# Patient Record
Sex: Female | Born: 1975 | Race: Black or African American | Hispanic: No | Marital: Single | Smoking: Current every day smoker
Health system: Southern US, Community
[De-identification: ages and names within clinical notes are randomized; demographics above are authoritative.]

## PROBLEM LIST (undated history)

## (undated) ENCOUNTER — Inpatient Hospital Stay (HOSPITAL_COMMUNITY): Payer: Self-pay

## (undated) DIAGNOSIS — O09299 Supervision of pregnancy with other poor reproductive or obstetric history, unspecified trimester: Secondary | ICD-10-CM

## (undated) HISTORY — PX: DILATION AND CURETTAGE OF UTERUS: SHX78

---

## 2001-11-04 ENCOUNTER — Encounter (INDEPENDENT_AMBULATORY_CARE_PROVIDER_SITE_OTHER): Payer: Self-pay | Admitting: *Deleted

## 2001-11-04 ENCOUNTER — Observation Stay (HOSPITAL_COMMUNITY): Admission: AD | Admit: 2001-11-04 | Discharge: 2001-11-05 | Payer: Self-pay | Admitting: *Deleted

## 2001-11-05 ENCOUNTER — Encounter: Payer: Self-pay | Admitting: *Deleted

## 2009-08-14 ENCOUNTER — Inpatient Hospital Stay (HOSPITAL_COMMUNITY): Admission: AD | Admit: 2009-08-14 | Discharge: 2009-08-16 | Payer: Self-pay | Admitting: Obstetrics & Gynecology

## 2009-08-14 ENCOUNTER — Ambulatory Visit: Payer: Self-pay | Admitting: Family

## 2010-12-18 LAB — CBC
HCT: 37.3 % (ref 36.0–46.0)
Hemoglobin: 12.2 g/dL (ref 12.0–15.0)
MCHC: 32.8 g/dL (ref 30.0–36.0)
MCV: 95.2 fL (ref 78.0–100.0)
Platelets: 279 10*3/uL (ref 150–400)
RBC: 3.92 MIL/uL (ref 3.87–5.11)
RDW: 14.3 % (ref 11.5–15.5)
WBC: 16.2 10*3/uL — ABNORMAL HIGH (ref 4.0–10.5)

## 2010-12-18 LAB — RAPID URINE DRUG SCREEN, HOSP PERFORMED
Amphetamines: NOT DETECTED
Barbiturates: NOT DETECTED
Benzodiazepines: NOT DETECTED
Cocaine: NOT DETECTED
Opiates: NOT DETECTED
Tetrahydrocannabinol: POSITIVE — AB

## 2010-12-18 LAB — ABO/RH: ABO/RH(D): O POS

## 2010-12-18 LAB — HEPATITIS B SURFACE ANTIGEN: Hepatitis B Surface Ag: NEGATIVE

## 2010-12-18 LAB — TYPE AND SCREEN
ABO/RH(D): O POS
Antibody Screen: NEGATIVE

## 2010-12-18 LAB — RPR: RPR Ser Ql: NONREACTIVE

## 2010-12-18 LAB — HIV ANTIBODY (ROUTINE TESTING W REFLEX): HIV: NONREACTIVE

## 2011-01-31 NOTE — Op Note (Signed)
[facility]  Patient:    [name], ZIRKELBACH Visit Number: [id number] MRN: [id number]          Service Type: Attending:  Ronda Fairly. Galen Daft, M.D. Dictated by:   Ronda Fairly. Galen Daft, M.D. Proc. Date: 11/05/01                             Operative Report  PREOPERATIVE DIAGNOSIS:       Incomplete abortion and inevitable abortion.  POSTOPERATIVE DIAGNOSIS:      Incomplete abortion and inevitable abortion.  OPERATION:                    Suction dilatation and evacuation.  SURGEON:                      Ronda Fairly. Galen Daft, M.D.  ANESTHESIA:                   Intravenous sedation and local anesthesia.  COMPLICATIONS:                None.  ESTIMATED BLOOD LOSS:         Less than 5 cc.  DESCRIPTION OF PROCEDURE:     The patient was identified as Shelley Duffy. Informed consent was obtained prior to entering the operating room.  In the operating room, she had already received preoperative antibiotics and was given intravenous sedation and 8 cc of 1% lidocaine was utilized around the cervix for a block.  Betadine prep and sterile technique.  Bladder catheterized and emptied.  The uterus was 8-10 weeks size on examination and the cervix was already dilated to accept a 10 mm suction curet.  This was placed at the top of the cavity and 60 mm pressure was drawn up and the tissue was removed without difficulty on the first pass.  There was sharp curet utilized in all the quadrants afterwards to make sure that there was no additional tissue and there was not.  There was no active bleeding at the end of the procedure.  The patient tolerated the procedure quite well and left the operating room in stable condition.  All instrument, sponge and needle counts were correct at the end of the case and they were correct throughout the case. There were no complications. Dictated by:   Ronda Fairly. Galen Daft, M.D. Attending:  Ronda Fairly. Galen Daft, M.D. DD:  11/05/01 TD:  11/05/01 Job:  [id number]

## 2011-02-13 ENCOUNTER — Emergency Department (HOSPITAL_COMMUNITY): Payer: No Typology Code available for payment source

## 2011-02-13 ENCOUNTER — Emergency Department (HOSPITAL_COMMUNITY)
Admission: EM | Admit: 2011-02-13 | Discharge: 2011-02-13 | Disposition: A | Discharge status: Home / Self Care | Payer: No Typology Code available for payment source | Attending: Emergency Medicine | Admitting: Emergency Medicine

## 2011-02-13 DIAGNOSIS — S8000XA Contusion of unspecified knee, initial encounter: Secondary | ICD-10-CM | POA: Insufficient documentation

## 2011-02-13 DIAGNOSIS — S335XXA Sprain of ligaments of lumbar spine, initial encounter: Secondary | ICD-10-CM | POA: Insufficient documentation
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
## 2012-02-20 ENCOUNTER — Encounter (HOSPITAL_COMMUNITY): Payer: Self-pay

## 2012-02-20 ENCOUNTER — Inpatient Hospital Stay (HOSPITAL_COMMUNITY)
Admission: AD | Admit: 2012-02-20 | Discharge: 2012-02-20 | Disposition: A | Discharge status: Home / Self Care | Payer: Self-pay | Source: Ambulatory Visit | Attending: Obstetrics & Gynecology | Admitting: Obstetrics & Gynecology

## 2012-02-20 DIAGNOSIS — N949 Unspecified condition associated with female genital organs and menstrual cycle: Secondary | ICD-10-CM

## 2012-02-20 DIAGNOSIS — A499 Bacterial infection, unspecified: Secondary | ICD-10-CM

## 2012-02-20 DIAGNOSIS — R109 Unspecified abdominal pain: Secondary | ICD-10-CM | POA: Insufficient documentation

## 2012-02-20 DIAGNOSIS — O2390 Unspecified genitourinary tract infection in pregnancy, unspecified trimester: Secondary | ICD-10-CM | POA: Insufficient documentation

## 2012-02-20 DIAGNOSIS — B9689 Other specified bacterial agents as the cause of diseases classified elsewhere: Secondary | ICD-10-CM | POA: Insufficient documentation

## 2012-02-20 DIAGNOSIS — N760 Acute vaginitis: Secondary | ICD-10-CM | POA: Insufficient documentation

## 2012-02-20 LAB — CBC
Hemoglobin: 10.2 g/dL — ABNORMAL LOW (ref 12.0–15.0)
MCV: 91.9 fL (ref 78.0–100.0)
Platelets: 261 10*3/uL (ref 150–400)
RBC: 3.21 MIL/uL — ABNORMAL LOW (ref 3.87–5.11)
WBC: 14.3 10*3/uL — ABNORMAL HIGH (ref 4.0–10.5)

## 2012-02-20 LAB — URINALYSIS, ROUTINE W REFLEX MICROSCOPIC
Bilirubin Urine: NEGATIVE
Glucose, UA: NEGATIVE mg/dL
Hgb urine dipstick: NEGATIVE
Specific Gravity, Urine: 1.01 (ref 1.005–1.030)
Urobilinogen, UA: 0.2 mg/dL (ref 0.0–1.0)
pH: 6 (ref 5.0–8.0)

## 2012-02-20 LAB — WET PREP, GENITAL: Trich, Wet Prep: NONE SEEN

## 2012-02-20 LAB — DIFFERENTIAL
Eosinophils Relative: 1 % (ref 0–5)
Lymphocytes Relative: 18 % (ref 12–46)
Lymphs Abs: 2.6 10*3/uL (ref 0.7–4.0)
Monocytes Relative: 8 % (ref 3–12)

## 2012-02-20 MED ORDER — METRONIDAZOLE 500 MG PO TABS: 500.0000 mg | ORAL_TABLET | Freq: Two times a day (BID) | ORAL | Status: AC

## 2012-02-20 NOTE — Discharge Instructions (Signed)
Bacterial Vaginosis Bacterial vaginosis is an infection of the vagina. A healthy vagina has many kinds of good germs (bacteria). Sometimes the number of good germs can change. This allows bad germs to move in and cause an infection. You may be given medicine (antibiotics) to treat the infection. Or, you may not need treatment at all. HOME CARE  Take your medicine as told. Finish them even if you start to feel better.   Do not have sex until you finish your medicine.   Do not douche.   Practice safe sex.   Tell your sex partner that you have an infection. They should see their doctor for treatment if they have problems.  GET HELP RIGHT AWAY IF:  You do not get better after 3 days of treatment.   You have grey fluid (discharge) coming from your vagina.   You have pain.   You have a temperature of 102 F (38.9 C) or higher.  MAKE SURE YOU:   Understand these instructions.   Will watch your condition.   Will get help right away if you are not doing well or get worse.  Document Released: 06/10/2008 Document Revised: 08/21/2011 Document Reviewed: 06/10/2008 [facility] Patient Information 2012 [place], [place].Round Ligament Pain The round ligament is made up of muscle and fibrous tissue. It is attached to the uterus near the fallopian tube. The round ligament is located on both sides of the uterus and helps support the position of the uterus. It usually begins in the second trimester of pregnancy when the uterus comes out of the pelvis. The pain can come and go until the baby is delivered. Round ligament pain is not a serious problem and does not cause harm to the baby. CAUSE During pregnancy the uterus grows the most from the second trimester to delivery. As it grows, it stretches and slightly twists the round ligaments. When the uterus leans from one side to the other, the round ligament on the opposite side pulls and stretches. This can cause pain. SYMPTOMS  Pain can occur on one side  or both sides. The pain is usually a short, sharp, and pinching-like. Sometimes it can be a dull, lingering and aching pain. The pain is located in the lower side of the abdomen or in the groin. The pain is internal and usually starts deep in the groin and moves up to the outside of the hip area. Pain can occur with:  Sudden change in position like getting out of bed or a chair.   Rolling over in bed.   Coughing or sneezing.   Walking too much.   Any type of physical activity.  DIAGNOSIS  Your caregiver will make sure there are no serious problems causing the pain. When nothing serious is found, the symptoms usually indicate that the pain is from the round ligament. TREATMENT   Sit down and relax when the pain starts.   Flex your knees up to your belly.   Lay on your side with a pillow under your belly (abdomen) and another one between your legs.   Sit in a hot bath for 15 to 20 minutes or until the pain goes away.  HOME CARE INSTRUCTIONS   Only take over-the-counter or prescriptions medicines for pain, discomfort or fever as directed by your caregiver.   Sit and stand slowly.   Avoid long walks if it causes pain.   Stop or lessen your physical activities if it causes pain.  SEEK MEDICAL CARE IF:   The pain  does not go away with any of your treatment.   You need stronger medication for the pain.   You develop back pain that you did not have before with the side pain.  SEEK IMMEDIATE MEDICAL CARE IF:   You develop a temperature of 102 F (38.9 C) or higher.   You develop uterine contractions.   You develop vaginal bleeding.   You develop nausea, vomiting or diarrhea.   You develop chills.   You have pain when you urinate.  Document Released: 06/10/2008 Document Revised: 08/21/2011 Document Reviewed: 06/10/2008 [facility] Patient Information 2012 [place], [place].

## 2012-02-20 NOTE — MAU Provider Note (Signed)
History     CSN: [id number]  Arrival date and time: 02/20/12 1119   First Provider Initiated Contact with Patient 02/20/12 1207      Chief Complaint  Patient presents with  . Abdominal Pain   HPI Shelley Duffy is 36 y.o. [id number] 100w0d weeks presenting with abdominal pain on the right and pressure on the right side that began at 2 am.  Plans care with Health Dept.  Denies vaginal bleeding.  Denies fever, chills , nausea and vomiting.   Last bowel movement that was normal.  Lying down make the pain better.      History reviewed. No pertinent past medical history.  Past Surgical History  Procedure Date  . Dilation and curettage of uterus     Family History  Problem Relation Age of Onset  . Anesthesia problems Neg Hx     History  Substance Use Topics  . Smoking status: Current Everyday Smoker -- 0.5 packs/day    Types: Cigarettes  . Smokeless tobacco: Never Used  . Alcohol Use: No    Allergies: No Known Allergies  Prescriptions prior to admission  Medication Sig Dispense Refill  . Prenatal Vit-Fe Fumarate-FA (PRENATAL MULTIVITAMIN) TABS Take 1 tablet by mouth daily.        Review of Systems  Constitutional: Negative.   Respiratory: Negative.   Cardiovascular: Negative.   Gastrointestinal: Positive for abdominal pain. Negative for nausea and vomiting.  Genitourinary:       Negative for vaginal discharge or vaginal bleeding   Physical Exam   Blood pressure 117/75, pulse 96, temperature 98.8 F (37.1 C), temperature source Oral, resp. rate 16, height 5' 7.5" (1.715 m), weight 58.695 kg (129 lb 6.4 oz), last menstrual period 11/28/2011.  Physical Exam  Constitutional: She is oriented to person, place, and time. She appears well-developed and well-nourished. No distress.  HENT:  Head: Normocephalic.  Neck: Normal range of motion.  Cardiovascular: Normal rate.   Respiratory: Effort normal.  GI: Soft. She exhibits no distension and no mass. There is tenderness.  There is no rebound and no guarding.  Genitourinary: There is no tenderness on the right labia. There is no tenderness on the left labia. Uterus is enlarged (meaures 16 week size.). Uterus is not tender. Cervix exhibits no discharge. No bleeding around the vagina. Vaginal discharge (small amount of frothy discharge) found.       + fetal heart tones 168  Neurological: She is alert and oriented to person, place, and time.  Skin: Skin is warm and dry.  Psychiatric: She has a normal mood and affect. Her behavior is normal. Thought content normal.   Results for orders placed during the hospital encounter of 02/20/12 (from the past 24 hour(s))  URINALYSIS, ROUTINE W REFLEX MICROSCOPIC     Status: Normal   Collection Time   02/20/12 11:45 AM      Component Value Range   Color, Urine YELLOW  YELLOW    APPearance CLEAR  CLEAR    Specific Gravity, Urine 1.010  1.005 - 1.030    pH 6.0  5.0 - 8.0    Glucose, UA NEGATIVE  NEGATIVE (mg/dL)   Hgb urine dipstick NEGATIVE  NEGATIVE    Bilirubin Urine NEGATIVE  NEGATIVE    Ketones, ur NEGATIVE  NEGATIVE (mg/dL)   Protein, ur NEGATIVE  NEGATIVE (mg/dL)   Urobilinogen, UA 0.2  0.0 - 1.0 (mg/dL)   Nitrite NEGATIVE  NEGATIVE    Leukocytes, UA NEGATIVE  NEGATIVE   CBC  Status: Abnormal   Collection Time   02/20/12 12:20 PM      Component Value Range   WBC 14.3 (*) 4.0 - 10.5 (K/uL)   RBC 3.21 (*) 3.87 - 5.11 (MIL/uL)   Hemoglobin 10.2 (*) 12.0 - 15.0 (g/dL)   HCT 16.1 (*) 09.6 - 46.0 (%)   MCV 91.9  78.0 - 100.0 (fL)   MCH 31.8  26.0 - 34.0 (pg)   MCHC 34.6  30.0 - 36.0 (g/dL)   RDW 04.5  40.9 - 81.1 (%)   Platelets 261  150 - 400 (K/uL)  DIFFERENTIAL     Status: Abnormal   Collection Time   02/20/12 12:20 PM      Component Value Range   Neutrophils Relative 73  43 - 77 (%)   Neutro Abs 10.5 (*) 1.7 - 7.7 (K/uL)   Lymphocytes Relative 18  12 - 46 (%)   Lymphs Abs 2.6  0.7 - 4.0 (K/uL)   Monocytes Relative 8  3 - 12 (%)   Monocytes Absolute 1.1  (*) 0.1 - 1.0 (K/uL)   Eosinophils Relative 1  0 - 5 (%)   Eosinophils Absolute 0.1  0.0 - 0.7 (K/uL)   Basophils Relative 0  0 - 1 (%)   Basophils Absolute 0.0  0.0 - 0.1 (K/uL)  WET PREP, GENITAL     Status: Abnormal   Collection Time   02/20/12 12:46 PM      Component Value Range   Yeast Wet Prep HPF POC NONE SEEN  NONE SEEN    Trich, Wet Prep NONE SEEN  NONE SEEN    Clue Cells Wet Prep HPF POC MANY (*) NONE SEEN    WBC, Wet Prep HPF POC MANY (*) NONE SEEN    MAU Course  ProceduresGC/CHL culture to lab. MDM   Assessment and Plan  A;  Round ligament pain at [redacted] weeks gestation by size      Bacterial vaginosis  P:  Rx for Flagyl 500mg  bid for 1 week      Encouraged her to begin prenatal care with doctor of your choice.  Jiyan Walkowski,EVE M 02/20/2012, 12:08 PM

## 2012-02-20 NOTE — MAU Note (Signed)
Patient states she has had a positive pregnancy test at [facility]. Started having right lower quadrant abdominal pressure this am. Denies any bleeding or discharge.

## 2012-02-21 LAB — GC/CHLAMYDIA PROBE AMP, GENITAL
Chlamydia, DNA Probe: NEGATIVE
GC Probe Amp, Genital: NEGATIVE

## 2012-05-13 ENCOUNTER — Inpatient Hospital Stay (HOSPITAL_COMMUNITY): Payer: Medicaid Other

## 2012-05-13 ENCOUNTER — Inpatient Hospital Stay (HOSPITAL_COMMUNITY)
Admission: AD | Admit: 2012-05-13 | Discharge: 2012-05-13 | Disposition: A | Discharge status: Home / Self Care | Payer: Medicaid Other | Source: Ambulatory Visit | Attending: Obstetrics and Gynecology | Admitting: Obstetrics and Gynecology

## 2012-05-13 ENCOUNTER — Encounter (HOSPITAL_COMMUNITY): Payer: Self-pay | Admitting: *Deleted

## 2012-05-13 DIAGNOSIS — M25519 Pain in unspecified shoulder: Secondary | ICD-10-CM | POA: Insufficient documentation

## 2012-05-13 DIAGNOSIS — M549 Dorsalgia, unspecified: Secondary | ICD-10-CM | POA: Insufficient documentation

## 2012-05-13 DIAGNOSIS — O26879 Cervical shortening, unspecified trimester: Secondary | ICD-10-CM

## 2012-05-13 DIAGNOSIS — O99891 Other specified diseases and conditions complicating pregnancy: Secondary | ICD-10-CM | POA: Insufficient documentation

## 2012-05-13 DIAGNOSIS — O09299 Supervision of pregnancy with other poor reproductive or obstetric history, unspecified trimester: Secondary | ICD-10-CM | POA: Insufficient documentation

## 2012-05-13 DIAGNOSIS — O09899 Supervision of other high risk pregnancies, unspecified trimester: Secondary | ICD-10-CM

## 2012-05-13 DIAGNOSIS — O0930 Supervision of pregnancy with insufficient antenatal care, unspecified trimester: Secondary | ICD-10-CM

## 2012-05-13 DIAGNOSIS — Y92410 Unspecified street and highway as the place of occurrence of the external cause: Secondary | ICD-10-CM | POA: Insufficient documentation

## 2012-05-13 DIAGNOSIS — O09219 Supervision of pregnancy with history of pre-term labor, unspecified trimester: Secondary | ICD-10-CM

## 2012-05-13 HISTORY — DX: Supervision of pregnancy with other poor reproductive or obstetric history, unspecified trimester: O09.299

## 2012-05-13 LAB — URINALYSIS, ROUTINE W REFLEX MICROSCOPIC
Bilirubin Urine: NEGATIVE
Hgb urine dipstick: NEGATIVE
Protein, ur: NEGATIVE mg/dL
Urobilinogen, UA: 0.2 mg/dL (ref 0.0–1.0)

## 2012-05-13 MED ORDER — ACETAMINOPHEN 325 MG PO TABS
650.0000 mg | ORAL_TABLET | Freq: Once | ORAL | Status: AC
  Administered 2012-05-13: 650 mg via ORAL
  Filled 2012-05-13: qty 2

## 2012-05-13 MED ORDER — BETAMETHASONE SOD PHOS & ACET 6 (3-3) MG/ML IJ SUSP
12.0000 mg | Freq: Once | INTRAMUSCULAR | Status: AC
  Administered 2012-05-13: 12 mg via INTRAMUSCULAR
  Filled 2012-05-13: qty 2
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
## 2012-05-14 NOTE — MAU Provider Note (Signed)
Agree with above note.  Shelley Duffy 05/14/2012 4:07 AM

## 2012-05-27 ENCOUNTER — Encounter: Payer: Self-pay | Admitting: Obstetrics and Gynecology

## 2012-06-08 ENCOUNTER — Inpatient Hospital Stay (HOSPITAL_COMMUNITY)
Admission: AD | Admit: 2012-06-08 | Discharge: 2012-06-10 | DRG: 782 | Discharge status: Against Medical Advice | Payer: Medicaid Other | Source: Ambulatory Visit | Attending: Obstetrics and Gynecology | Admitting: Obstetrics and Gynecology

## 2012-06-08 ENCOUNTER — Encounter (HOSPITAL_COMMUNITY): Payer: Self-pay | Admitting: *Deleted

## 2012-06-08 DIAGNOSIS — O321XX0 Maternal care for breech presentation, not applicable or unspecified: Secondary | ICD-10-CM | POA: Diagnosis present

## 2012-06-08 DIAGNOSIS — O4290 Premature rupture of membranes, unspecified as to length of time between rupture and onset of labor, unspecified weeks of gestation: Principal | ICD-10-CM | POA: Diagnosis present

## 2012-06-08 DIAGNOSIS — O0930 Supervision of pregnancy with insufficient antenatal care, unspecified trimester: Secondary | ICD-10-CM

## 2012-06-08 DIAGNOSIS — O368390 Maternal care for abnormalities of the fetal heart rate or rhythm, unspecified trimester, not applicable or unspecified: Secondary | ICD-10-CM | POA: Diagnosis present

## 2012-06-08 DIAGNOSIS — O09899 Supervision of other high risk pregnancies, unspecified trimester: Secondary | ICD-10-CM

## 2012-06-08 DIAGNOSIS — O42913 Preterm premature rupture of membranes, unspecified as to length of time between rupture and onset of labor, third trimester: Secondary | ICD-10-CM | POA: Diagnosis present

## 2012-06-08 LAB — RAPID HIV SCREEN (WH-MAU): Rapid HIV Screen: NONREACTIVE

## 2012-06-08 LAB — WET PREP, GENITAL: Clue Cells Wet Prep HPF POC: NONE SEEN

## 2012-06-08 LAB — RAPID URINE DRUG SCREEN, HOSP PERFORMED
Barbiturates: NOT DETECTED
Benzodiazepines: NOT DETECTED
Cocaine: NOT DETECTED

## 2012-06-08 LAB — URINALYSIS, ROUTINE W REFLEX MICROSCOPIC
Bilirubin Urine: NEGATIVE
Glucose, UA: NEGATIVE mg/dL
Hgb urine dipstick: NEGATIVE
Ketones, ur: NEGATIVE mg/dL
Protein, ur: NEGATIVE mg/dL

## 2012-06-08 LAB — DIFFERENTIAL
Band Neutrophils: 6 % (ref 0–10)
Basophils Absolute: 0 10*3/uL (ref 0.0–0.1)
Basophils Relative: 0 % (ref 0–1)
Eosinophils Absolute: 0.1 10*3/uL (ref 0.0–0.7)
Eosinophils Relative: 1 % (ref 0–5)
Lymphocytes Relative: 26 % (ref 12–46)
Lymphs Abs: 3.7 10*3/uL (ref 0.7–4.0)
Monocytes Absolute: 0.4 10*3/uL (ref 0.1–1.0)
Monocytes Relative: 3 % (ref 3–12)
Neutro Abs: 9.9 10*3/uL — ABNORMAL HIGH (ref 1.7–7.7)
Neutrophils Relative %: 64 % (ref 43–77)

## 2012-06-08 LAB — CBC
HCT: 30.4 % — ABNORMAL LOW (ref 36.0–46.0)
Hemoglobin: 10.5 g/dL — ABNORMAL LOW (ref 12.0–15.0)
MCHC: 34.5 g/dL (ref 30.0–36.0)
RBC: 3.43 MIL/uL — ABNORMAL LOW (ref 3.87–5.11)
WBC: 14.1 10*3/uL — ABNORMAL HIGH (ref 4.0–10.5)

## 2012-06-08 LAB — TYPE AND SCREEN

## 2012-06-08 LAB — POCT FERN TEST: Fern Test: POSITIVE

## 2012-06-08 MED ORDER — DOCUSATE SODIUM 100 MG PO CAPS
100.0000 mg | ORAL_CAPSULE | Freq: Every day | ORAL | Status: DC
  Administered 2012-06-09 – 2012-06-10 (×2): 100 mg via ORAL
  Filled 2012-06-08 (×3): qty 1

## 2012-06-08 MED ORDER — ERYTHROMYCIN BASE 250 MG PO TABS
250.0000 mg | ORAL_TABLET | Freq: Four times a day (QID) | ORAL | Status: DC
  Administered 2012-06-10 (×2): 250 mg via ORAL
  Filled 2012-06-08 (×5): qty 1

## 2012-06-08 MED ORDER — BETAMETHASONE SOD PHOS & ACET 6 (3-3) MG/ML IJ SUSP
12.0000 mg | INTRAMUSCULAR | Status: AC
  Administered 2012-06-08 – 2012-06-09 (×2): 12 mg via INTRAMUSCULAR
  Filled 2012-06-08 (×2): qty 2

## 2012-06-08 MED ORDER — ACETAMINOPHEN 325 MG PO TABS: 650.0000 mg | ORAL_TABLET | ORAL | Status: DC | PRN

## 2012-06-08 MED ORDER — INFLUENZA VIRUS VACC SPLIT PF IM SUSP
0.5000 mL | INTRAMUSCULAR | Status: AC
  Administered 2012-06-09: 0.5 mL via INTRAMUSCULAR
  Filled 2012-06-08: qty 0.5

## 2012-06-08 MED ORDER — LACTATED RINGERS IV SOLN
INTRAVENOUS | Status: DC
  Administered 2012-06-08 (×2): via INTRAVENOUS
  Administered 2012-06-09 – 2012-06-10 (×3): 125 mL/h via INTRAVENOUS
  Administered 2012-06-10: 04:00:00 via INTRAVENOUS

## 2012-06-08 MED ORDER — BETAMETHASONE SOD PHOS & ACET 6 (3-3) MG/ML IJ SUSP: 12.0000 mg | Freq: Once | INTRAMUSCULAR | Status: DC

## 2012-06-08 MED ORDER — PRENATAL MULTIVITAMIN CH
1.0000 | ORAL_TABLET | Freq: Every day | ORAL | Status: DC
  Administered 2012-06-09 – 2012-06-10 (×2): 1 via ORAL
  Filled 2012-06-08 (×3): qty 1

## 2012-06-08 MED ORDER — MAGNESIUM SULFATE 40 G IN LACTATED RINGERS - SIMPLE
2.0000 g/h | INTRAVENOUS | Status: AC
  Administered 2012-06-08: 2 g/h via INTRAVENOUS
  Filled 2012-06-08: qty 500

## 2012-06-08 MED ORDER — SODIUM CHLORIDE 0.9 % IV SOLN
250.0000 mg | Freq: Four times a day (QID) | INTRAVENOUS | Status: AC
  Administered 2012-06-08 – 2012-06-10 (×8): 250 mg via INTRAVENOUS
  Filled 2012-06-08 (×9): qty 250

## 2012-06-08 MED ORDER — AMOXICILLIN 500 MG PO CAPS
500.0000 mg | ORAL_CAPSULE | Freq: Three times a day (TID) | ORAL | Status: DC
  Administered 2012-06-10 (×2): 500 mg via ORAL
  Filled 2012-06-08 (×4): qty 1

## 2012-06-08 MED ORDER — SODIUM CHLORIDE 0.9 % IV SOLN
2.0000 g | Freq: Four times a day (QID) | INTRAVENOUS | Status: AC
  Administered 2012-06-08 – 2012-06-10 (×8): 2 g via INTRAVENOUS
  Filled 2012-06-08 (×8): qty 2000

## 2012-06-08 MED ORDER — ZOLPIDEM TARTRATE 5 MG PO TABS: 5.0000 mg | ORAL_TABLET | Freq: Every evening | ORAL | Status: DC | PRN

## 2012-06-08 MED ORDER — CALCIUM CARBONATE ANTACID 500 MG PO CHEW
2.0000 | CHEWABLE_TABLET | ORAL | Status: DC | PRN
  Filled 2012-06-08: qty 2

## 2012-06-08 NOTE — H&P (Signed)
Attestation of Attending Supervision of Advanced Practitioner (CNM/NP): Evaluation and management procedures were performed by the Advanced Practitioner under my supervision and collaboration.  I have reviewed the Advanced Practitioner's note and chart, and I agree with the management and plan.  Markan Cazarez 06/08/2012 2:11 PM   

## 2012-06-08 NOTE — H&P (Signed)
Shelley Duffy is a 36 y.o. female presenting for leaking of clear fluid since 0730 this morning. Maternal Medical History:  Reason for admission: Reason for admission: rupture of membranes.  Reason for Admission:   nauseaContractions: Frequency: irregular.   Perceived severity is mild.    Fetal activity: Perceived fetal activity is normal.    Prenatal complications: No prenatal care     OB History    Grav Para Term Preterm Abortions TAB SAB Ect Mult Living   6 4 1 3 1  0 1 0 0 3     Past Medical History  Diagnosis Date  . Previous stillbirth or demise, antepartum    Past Surgical History  Procedure Date  . Dilation and curettage of uterus    Family History: family history is negative for Anesthesia problems. Social History:  reports that she has been smoking Cigarettes.  She has been smoking about .5 packs per day. She has never used smokeless tobacco. She reports that she does not drink alcohol or use illicit drugs.   Prenatal Transfer Tool  Maternal Diabetes: No Genetic Screening: Declined Maternal Ultrasounds/Referrals: Normal Fetal Ultrasounds or other Referrals:  None Maternal Substance Abuse:  Yes:  Type: Smoker Significant Maternal Medications:  None Significant Maternal Lab Results:  None Other Comments:  no prenatal care  Review of Systems  Constitutional: Negative.   Respiratory: Negative.   Cardiovascular: Negative.   Gastrointestinal: Negative for nausea, vomiting, abdominal pain, diarrhea and constipation.  Genitourinary: Negative for dysuria, urgency, frequency, hematuria and flank pain.       Negative for vaginal bleeding, + Irregular contractions and LOF  Musculoskeletal: Negative.   Neurological: Negative.   Psychiatric/Behavioral: Negative.     Dilation: Fingertip (Outer os 1cm) Effacement (%): Thick Station: -3 Exam by:: Dr. Greggory Brandy Blood pressure 109/75, pulse 99, temperature 97.1 F (36.2 C), temperature source Oral, resp. rate 18,  height 5' 5.5" (1.664 m), weight 131 lb 12.8 oz (59.784 kg), last menstrual period 10/31/2011. Maternal Exam:  Uterine Assessment: Contraction strength is mild.  Contraction frequency is irregular.   Abdomen: Fetal presentation: vertex  Introitus: Normal vulva. Normal vagina.  Ferning test: positive.  Amniotic fluid character: clear.  Pelvis: adequate for delivery.   Cervix: Cervix evaluated by sterile speculum exam and digital exam.     Fetal Exam Fetal Monitor Review: Baseline rate: 140.  Variability: moderate (6-25 bpm).   Pattern: accelerations present and no decelerations.    Fetal State Assessment: Category I - tracings are normal.     Physical Exam  Nursing note and vitals reviewed. Constitutional: She is oriented to person, place, and time. She appears well-developed and well-nourished. No distress.  Cardiovascular: Normal rate.   Respiratory: Effort normal.  GI: Soft. There is no tenderness.  Musculoskeletal: Normal range of motion.  Neurological: She is alert and oriented to person, place, and time.  Skin: Skin is warm.  Psychiatric: She has a normal mood and affect.    Prenatal labs: pending, no prenatal care ABO, Rh:   Antibody:   Rubella:   RPR:    HBsAg:    HIV:    GBS:     Assessment/Plan: 1. Preterm premature rupture of membranes in third trimester   2. History of preterm delivery, currently pregnant   3. Insufficient prenatal care    1. Admit to antepartum 2. Rapid GBS, GC/CT collected, OB panel, UDS ordered 3. Ampicillin/Erythromycin, Magnesium Sulfate 2 gm/hr x 12 hours, BMZ    Dondra Rhett 06/08/2012, 11:22 AM

## 2012-06-08 NOTE — Care Management Note (Signed)
  Page 1 of 1   06/08/2012     3:05:21 PM   CARE MANAGEMENT NOTE 06/08/2012  Patient:  VISTA, SAWATZKY   Account Number:  [id number]  Date Initiated:  06/08/2012  Documentation initiated by:  Hoy Finlay  Subjective/Objective Assessment:   31 5/[redacted]wks gestation  PPROM     Action/Plan:   MgSO4 / Betamethasone  IV antibiotics  in-house until delivery   Comments:  06/08/12 H. Montez Morita, RN, BSN 1500- Patient admitted at 22 5/[redacted]wks gestation with PPROM. Anticipate in-house until delivery. CM received consult for this self-pay patient with questions about applying for Medicaid. CM contacted Burman Foster, Artist, who will see the patient and assist with application. Patient is aware. No other discharge needs identified at this time. UR chart review completed. CM will continue to follow.

## 2012-06-08 NOTE — MAU Note (Signed)
Noted clear leaking of fluid from vagina around 0745. Has continued to leak. No pain. Has hx PTL and PTD.

## 2012-06-08 NOTE — Progress Notes (Signed)
Patient passed 4 cm clot while up to bathroom. MD notified. No new orders.

## 2012-06-09 ENCOUNTER — Inpatient Hospital Stay (HOSPITAL_COMMUNITY): Payer: Medicaid Other

## 2012-06-09 ENCOUNTER — Encounter (HOSPITAL_COMMUNITY): Payer: Self-pay | Admitting: Family

## 2012-06-09 DIAGNOSIS — O4290 Premature rupture of membranes, unspecified as to length of time between rupture and onset of labor, unspecified weeks of gestation: Principal | ICD-10-CM

## 2012-06-09 LAB — RPR: RPR Ser Ql: NONREACTIVE

## 2012-06-09 NOTE — Progress Notes (Signed)
Patient ID: KESS FETTY, female   DOB: March 14, 1976, 36 y.o.   MRN: [id number] FACULTY PRACTICE ANTEPARTUM(COMPREHENSIVE) NOTE  Shelley Duffy is a 36 y.o. [id number] at [redacted]w[redacted]d who is admitted for PROM.   Fetal presentation is cephalic. Length of Stay:  1  Days  Subjective: Patient is without complaints. She passed a small blood clot yesterday afternoon but no bleeding since Patient reports the fetal movement as active. Patient reports uterine contraction  activity as none. Patient reports  vaginal bleeding as none. Patient describes fluid per vagina as Clear.  Vitals:  Blood pressure 103/70, pulse 83, temperature 98.2 F (36.8 C), temperature source Oral, resp. rate 18, height 5' 5.5" (1.664 m), weight 59.875 kg (132 lb), last menstrual period 10/31/2011, SpO2 96.00%. Physical Examination:  General appearance - alert, well appearing, and in no distress Fundal Height:  size equals dates Pelvic Exam:  examination not indicated Cervical Exam: Not evaluated.  Extremities: no edema, redness or tenderness in the calves or thighs with DTRs 2+ bilaterally Membranes:ruptured  Fetal Monitoring:  Baseline: 120 bpm, Variability: Good {> 6 bpm), Accelerations: Reactive and Decelerations: Absent  Labs:  Recent Results (from the past 24 hour(s))  URINALYSIS, ROUTINE W REFLEX MICROSCOPIC   Collection Time   06/08/12  8:31 AM      Component Value Range   Color, Urine YELLOW  YELLOW   APPearance CLEAR  CLEAR   Specific Gravity, Urine <1.005 (*) 1.005 - 1.030   pH 6.5  5.0 - 8.0   Glucose, UA NEGATIVE  NEGATIVE mg/dL   Hgb urine dipstick NEGATIVE  NEGATIVE   Bilirubin Urine NEGATIVE  NEGATIVE   Ketones, ur NEGATIVE  NEGATIVE mg/dL   Protein, ur NEGATIVE  NEGATIVE mg/dL   Urobilinogen, UA 0.2  0.0 - 1.0 mg/dL   Nitrite NEGATIVE  NEGATIVE   Leukocytes, UA NEGATIVE  NEGATIVE  GROUP B STREP BY PCR   Collection Time   06/08/12  9:55 AM      Component Value Range   Group B strep by PCR NEGATIVE   NEGATIVE  WET PREP, GENITAL   Collection Time   06/08/12  9:55 AM      Component Value Range   Yeast Wet Prep HPF POC NONE SEEN  NONE SEEN   Trich, Wet Prep NONE SEEN  NONE SEEN   Clue Cells Wet Prep HPF POC NONE SEEN  NONE SEEN   WBC, Wet Prep HPF POC MANY (*) NONE SEEN  RUBELLA SCREEN   Collection Time   06/08/12 10:23 AM      Component Value Range   Rubella 127.9 (*)   CBC   Collection Time   06/08/12 10:23 AM      Component Value Range   WBC 14.1 (*) 4.0 - 10.5 K/uL   RBC 3.43 (*) 3.87 - 5.11 MIL/uL   Hemoglobin 10.5 (*) 12.0 - 15.0 g/dL   HCT 14.7 (*) 82.9 - 56.2 %   MCV 88.6  78.0 - 100.0 fL   MCH 30.6  26.0 - 34.0 pg   MCHC 34.5  30.0 - 36.0 g/dL   RDW 13.0  86.5 - 78.4 %   Platelets 242  150 - 400 K/uL  DIFFERENTIAL   Collection Time   06/08/12 10:23 AM      Component Value Range   Neutrophils Relative 64  43 - 77 %   Lymphocytes Relative 26  12 - 46 %   Monocytes Relative 3  3 - 12 %  Eosinophils Relative 1  0 - 5 %   Basophils Relative 0  0 - 1 %   Band Neutrophils 6  0 - 10 %   Metamyelocytes Relative 0     Myelocytes 0     Promyelocytes Absolute 0     Blasts 0     nRBC 0  0 /100 WBC   Neutro Abs 9.9 (*) 1.7 - 7.7 K/uL   Lymphs Abs 3.7  0.7 - 4.0 K/uL   Monocytes Absolute 0.4  0.1 - 1.0 K/uL   Eosinophils Absolute 0.1  0.0 - 0.7 K/uL   Basophils Absolute 0.0  0.0 - 0.1 K/uL   Smear Review LARGE PLATELETS PRESENT    RAPID HIV SCREEN ([facility])   Collection Time   06/08/12 10:23 AM      Component Value Range   SUDS Rapid HIV Screen NON REACTIVE  NON REACTIVE  POCT FERN TEST   Collection Time   06/08/12 10:33 AM      Component Value Range   Fern Test Positive    URINE RAPID DRUG SCREEN (HOSP PERFORMED)   Collection Time   06/08/12 11:20 AM      Component Value Range   Opiates NONE DETECTED  NONE DETECTED   Cocaine NONE DETECTED  NONE DETECTED   Benzodiazepines NONE DETECTED  NONE DETECTED   Amphetamines NONE DETECTED  NONE DETECTED    Tetrahydrocannabinol NONE DETECTED  NONE DETECTED   Barbiturates NONE DETECTED  NONE DETECTED  GLUCOSE, CAPILLARY   Collection Time   06/08/12 11:39 AM      Component Value Range   Glucose-Capillary 77  70 - 99 mg/dL  TYPE AND SCREEN   Collection Time   06/08/12  1:28 PM      Component Value Range   ABO/RH(D) O POS     Antibody Screen NEG     Sample Expiration 06/11/2012      Imaging Studies:      Medications:  Scheduled    . ampicillin (OMNIPEN) IV  2 g Intravenous Q6H   Followed by  . amoxicillin  500 mg Oral Q8H  . betamethasone acetate-betamethasone sodium phosphate  12 mg Intramuscular Q24H  . docusate sodium  100 mg Oral Daily  . erythromycin  250 mg Intravenous Q6H   Followed by  . erythromycin  250 mg Oral Q6H  . influenza  inactive virus vaccine  0.5 mL Intramuscular Tomorrow-1000  . prenatal multivitamin  1 tablet Oral Daily  . DISCONTD: betamethasone acetate-betamethasone sodium phosphate  12 mg Intramuscular Once   I have reviewed the patient's current medications.  ASSESSMENT: Patient Active Problem List  Diagnosis  . Previous stillbirth or demise, antepartum  . Preterm premature rupture of membranes in third trimester  . History of preterm delivery, currently pregnant  . Insufficient prenatal care    PLAN: - Patient to complete betamethasone course today - Continue latency antibiotics - Continue monitoring for si/sx of chorioamnionitis  Orvis Stann 06/09/2012,7:00 AM

## 2012-06-09 NOTE — Plan of Care (Signed)
Problem: Consults Goal: Birthing Suites Patient Information Press F2 to bring up selections list   Pt < [redacted] weeks EGA     

## 2012-06-10 ENCOUNTER — Encounter: Payer: Self-pay | Admitting: Obstetrics and Gynecology

## 2012-06-10 NOTE — Progress Notes (Signed)
Patient ID: PRINCE OLIVIER, female   DOB: 02/25/1976, 36 y.o.   MRN: [id number] Patient ID: KRISTYANA NOTTE, female   DOB: December 19, 1975, 36 y.o.   MRN: [id number] FACULTY PRACTICE ANTEPARTUM(COMPREHENSIVE) NOTE  DENIM START is a 36 y.o. [id number] at [redacted]w[redacted]d who is admitted for PROM.   Fetal presentation is breech. Length of Stay:  2  Days  Subjective: Patient is without complaints. No bleeding Patient reports the fetal movement as active. Patient reports uterine contraction  activity as none. Patient reports  vaginal bleeding as none. Patient describes fluid per vagina as Clear.  Vitals:  Blood pressure 99/56, pulse 92, temperature 98.2 F (36.8 C), temperature source Oral, resp. rate 18, height 5' 5.5" (1.664 m), weight 132 lb (59.875 kg), last menstrual period 10/31/2011, SpO2 96.00%. Physical Examination:  General appearance - alert, well appearing, and in no distress Fundal Height:  size equals dates Pelvic Exam:  examination not indicated Cervical Exam: Not evaluated.  Extremities: no edema, redness or tenderness in the calves or thighs with DTRs 2+ bilaterally Membranes:ruptured  Fetal Monitoring:  Reactive baseline 140s with occasional variable deceleration  Labs:  No results found for this or any previous visit (from the past 24 hour(s)).  Imaging Studies:      Medications:  Scheduled    . ampicillin (OMNIPEN) IV  2 g Intravenous Q6H   Followed by  . amoxicillin  500 mg Oral Q8H  . betamethasone acetate-betamethasone sodium phosphate  12 mg Intramuscular Q24H  . docusate sodium  100 mg Oral Daily  . erythromycin  250 mg Intravenous Q6H   Followed by  . erythromycin  250 mg Oral Q6H  . influenza  inactive virus vaccine  0.5 mL Intramuscular Tomorrow-1000  . prenatal multivitamin  1 tablet Oral Daily   I have reviewed the patient's current medications.  ASSESSMENT: IUP 31 6/7 weeks, no evidence of chorio at this point Patient Active Problem List  Diagnosis  . Previous  stillbirth or demise, antepartum  . Preterm premature rupture of membranes in third trimester  . History of preterm delivery, currently pregnant  . Insufficient prenatal care    PLAN: -Continue conservative management, will need Caesarean section due to breech - Continue latency antibiotics - Continue monitoring for si/sx of chorioamnionitis  Parnika Tweten H 06/10/2012,7:38 AM

## 2012-06-10 NOTE — Discharge Summary (Signed)
  Obstetric Discharge Summary Reason for Admission: PPROM Prenatal Procedures: ultrasound Complications- left AMA before counseling could be done   Pt told nurse that she had no one except her drug addicted, house arrested sister to care for her 3 kids, and left without telling anyone  H/H: Lab Results  Component Value Date/Time   HGB 10.5* 06/08/2012 10:23 AM   HCT 30.4* 06/08/2012 10:23 AM      Discharge Diagnoses: PPROM, breech, left AMA 31.6 weeks  Discharge Information: Date: 03/27/2011 Activity: pelvic rest Diet: routine  Medications: PNV Condition: unstable Instructions: Pt left room without telling anyone Discharge to: AMA      Medication List     As of 06/10/2012 10:57 PM    TAKE these medications         multivitamin with minerals Tabs   Take 1 tablet by mouth daily.      prenatal multivitamin Tabs   Take 1 tablet by mouth daily.         Shelley Duffy,Shelley Duffy 06/10/2012,10:57 PM

## 2012-06-11 ENCOUNTER — Inpatient Hospital Stay (HOSPITAL_COMMUNITY): Payer: Medicaid Other | Admitting: Anesthesiology

## 2012-06-11 ENCOUNTER — Encounter (HOSPITAL_COMMUNITY)
Admission: AD | Disposition: A | Discharge status: Home / Self Care | Payer: Self-pay | Source: Ambulatory Visit | Attending: Obstetrics & Gynecology

## 2012-06-11 ENCOUNTER — Encounter (HOSPITAL_COMMUNITY): Payer: Self-pay | Admitting: Anesthesiology

## 2012-06-11 ENCOUNTER — Inpatient Hospital Stay (HOSPITAL_COMMUNITY)
Admission: AD | Admit: 2012-06-11 | Discharge: 2012-06-13 | DRG: 765 | Disposition: A | Discharge status: Home / Self Care | Payer: Medicaid Other | Source: Ambulatory Visit | Attending: Obstetrics & Gynecology | Admitting: Obstetrics & Gynecology

## 2012-06-11 ENCOUNTER — Encounter (HOSPITAL_COMMUNITY): Payer: Self-pay | Admitting: *Deleted

## 2012-06-11 DIAGNOSIS — O321XX0 Maternal care for breech presentation, not applicable or unspecified: Secondary | ICD-10-CM | POA: Diagnosis present

## 2012-06-11 DIAGNOSIS — O0930 Supervision of pregnancy with insufficient antenatal care, unspecified trimester: Secondary | ICD-10-CM

## 2012-06-11 DIAGNOSIS — O4290 Premature rupture of membranes, unspecified as to length of time between rupture and onset of labor, unspecified weeks of gestation: Secondary | ICD-10-CM | POA: Diagnosis present

## 2012-06-11 LAB — CBC
MCH: 31.3 pg (ref 26.0–34.0)
MCV: 90.1 fL (ref 78.0–100.0)
Platelets: 267 10*3/uL (ref 150–400)
RDW: 13.7 % (ref 11.5–15.5)

## 2012-06-11 LAB — RAPID URINE DRUG SCREEN, HOSP PERFORMED: Amphetamines: NOT DETECTED

## 2012-06-11 SURGERY — Surgical Case
Anesthesia: Spinal | Site: Abdomen | Wound class: Clean Contaminated

## 2012-06-11 MED ORDER — DIPHENHYDRAMINE HCL 50 MG/ML IJ SOLN: 25.0000 mg | INTRAMUSCULAR | Status: DC | PRN

## 2012-06-11 MED ORDER — FENTANYL CITRATE 0.05 MG/ML IJ SOLN
INTRAMUSCULAR | Status: AC
  Filled 2012-06-11: qty 2

## 2012-06-11 MED ORDER — HYDROMORPHONE HCL PF 1 MG/ML IJ SOLN: 0.2500 mg | INTRAMUSCULAR | Status: DC | PRN

## 2012-06-11 MED ORDER — LACTATED RINGERS IV SOLN
INTRAVENOUS | Status: DC | PRN
  Administered 2012-06-11 (×2): via INTRAVENOUS

## 2012-06-11 MED ORDER — SODIUM CHLORIDE 0.9 % IV SOLN: 1.0000 ug/kg/h | INTRAVENOUS | Status: DC | PRN

## 2012-06-11 MED ORDER — ZOLPIDEM TARTRATE 5 MG PO TABS: 5.0000 mg | ORAL_TABLET | Freq: Every evening | ORAL | Status: DC | PRN

## 2012-06-11 MED ORDER — OXYCODONE-ACETAMINOPHEN 5-325 MG PO TABS
1.0000 | ORAL_TABLET | ORAL | Status: DC | PRN
  Administered 2012-06-11 – 2012-06-13 (×9): 2 via ORAL
  Filled 2012-06-11 (×9): qty 2

## 2012-06-11 MED ORDER — CEFAZOLIN SODIUM-DEXTROSE 2-3 GM-% IV SOLR
INTRAVENOUS | Status: DC | PRN
  Administered 2012-06-11: 2 g via INTRAVENOUS

## 2012-06-11 MED ORDER — ONDANSETRON HCL 4 MG/2ML IJ SOLN
INTRAMUSCULAR | Status: DC | PRN
  Administered 2012-06-11: 4 mg via INTRAVENOUS

## 2012-06-11 MED ORDER — LANOLIN HYDROUS EX OINT: 1.0000 "application " | TOPICAL_OINTMENT | CUTANEOUS | Status: DC | PRN

## 2012-06-11 MED ORDER — OXYTOCIN 40 UNITS IN LACTATED RINGERS INFUSION - SIMPLE MED: 62.5000 mL/h | INTRAVENOUS | Status: AC

## 2012-06-11 MED ORDER — OXYTOCIN 10 UNIT/ML IJ SOLN
INTRAMUSCULAR | Status: AC
  Filled 2012-06-11: qty 4

## 2012-06-11 MED ORDER — DIPHENHYDRAMINE HCL 25 MG PO CAPS: 25.0000 mg | ORAL_CAPSULE | Freq: Four times a day (QID) | ORAL | Status: DC | PRN

## 2012-06-11 MED ORDER — TETANUS-DIPHTH-ACELL PERTUSSIS 5-2.5-18.5 LF-MCG/0.5 IM SUSP
0.5000 mL | Freq: Once | INTRAMUSCULAR | Status: AC
  Administered 2012-06-12: 0.5 mL via INTRAMUSCULAR
  Filled 2012-06-11: qty 0.5

## 2012-06-11 MED ORDER — SIMETHICONE 80 MG PO CHEW
80.0000 mg | CHEWABLE_TABLET | Freq: Three times a day (TID) | ORAL | Status: DC
  Administered 2012-06-11 – 2012-06-13 (×9): 80 mg via ORAL

## 2012-06-11 MED ORDER — KETOROLAC TROMETHAMINE 30 MG/ML IJ SOLN: 30.0000 mg | Freq: Four times a day (QID) | INTRAMUSCULAR | Status: AC | PRN

## 2012-06-11 MED ORDER — SENNOSIDES-DOCUSATE SODIUM 8.6-50 MG PO TABS
2.0000 | ORAL_TABLET | Freq: Every evening | ORAL | Status: DC | PRN
  Administered 2012-06-11 – 2012-06-12 (×2): 2 via ORAL

## 2012-06-11 MED ORDER — MORPHINE SULFATE (PF) 0.5 MG/ML IJ SOLN
INTRAMUSCULAR | Status: DC | PRN
  Administered 2012-06-11: .2 mg via INTRATHECAL

## 2012-06-11 MED ORDER — SCOPOLAMINE 1 MG/3DAYS TD PT72
1.0000 | MEDICATED_PATCH | Freq: Once | TRANSDERMAL | Status: DC
  Administered 2012-06-11: 1.5 mg via TRANSDERMAL

## 2012-06-11 MED ORDER — IBUPROFEN 600 MG PO TABS
600.0000 mg | ORAL_TABLET | Freq: Four times a day (QID) | ORAL | Status: DC
  Administered 2012-06-11 – 2012-06-13 (×10): 600 mg via ORAL
  Filled 2012-06-11 (×11): qty 1

## 2012-06-11 MED ORDER — ONDANSETRON HCL 4 MG/2ML IJ SOLN: 4.0000 mg | INTRAMUSCULAR | Status: DC | PRN

## 2012-06-11 MED ORDER — DIPHENHYDRAMINE HCL 50 MG/ML IJ SOLN
12.5000 mg | INTRAMUSCULAR | Status: DC | PRN
  Administered 2012-06-11: 12.5 mg via INTRAVENOUS
  Filled 2012-06-11: qty 1

## 2012-06-11 MED ORDER — BUPIVACAINE HCL 0.25 % IJ SOLN
INTRAMUSCULAR | Status: DC | PRN
  Administered 2012-06-11: 20 mL

## 2012-06-11 MED ORDER — KETOROLAC TROMETHAMINE 60 MG/2ML IM SOLN
INTRAMUSCULAR | Status: AC
  Filled 2012-06-11: qty 2

## 2012-06-11 MED ORDER — SIMETHICONE 80 MG PO CHEW: 80.0000 mg | CHEWABLE_TABLET | ORAL | Status: DC | PRN

## 2012-06-11 MED ORDER — KETOROLAC TROMETHAMINE 30 MG/ML IJ SOLN: 15.0000 mg | Freq: Once | INTRAMUSCULAR | Status: DC | PRN

## 2012-06-11 MED ORDER — DIPHENHYDRAMINE HCL 25 MG PO CAPS: 25.0000 mg | ORAL_CAPSULE | ORAL | Status: DC | PRN

## 2012-06-11 MED ORDER — MEPERIDINE HCL 25 MG/ML IJ SOLN: 6.2500 mg | INTRAMUSCULAR | Status: DC | PRN

## 2012-06-11 MED ORDER — PNEUMOCOCCAL VAC POLYVALENT 25 MCG/0.5ML IJ INJ
0.5000 mL | INJECTION | INTRAMUSCULAR | Status: AC
  Administered 2012-06-12: 0.5 mL via INTRAMUSCULAR
  Filled 2012-06-11 (×2): qty 0.5

## 2012-06-11 MED ORDER — KETOROLAC TROMETHAMINE 60 MG/2ML IM SOLN
60.0000 mg | Freq: Once | INTRAMUSCULAR | Status: AC | PRN
  Administered 2012-06-11: 60 mg via INTRAMUSCULAR

## 2012-06-11 MED ORDER — ONDANSETRON HCL 4 MG PO TABS: 4.0000 mg | ORAL_TABLET | ORAL | Status: DC | PRN

## 2012-06-11 MED ORDER — ONDANSETRON HCL 4 MG/2ML IJ SOLN
INTRAMUSCULAR | Status: AC
  Filled 2012-06-11: qty 2

## 2012-06-11 MED ORDER — BUPIVACAINE IN DEXTROSE 0.75-8.25 % IT SOLN
INTRATHECAL | Status: DC | PRN
  Administered 2012-06-11: 1.4 mL via INTRATHECAL

## 2012-06-11 MED ORDER — MEASLES, MUMPS & RUBELLA VAC ~~LOC~~ INJ
0.5000 mL | INJECTION | Freq: Once | SUBCUTANEOUS | Status: DC
  Filled 2012-06-11 (×2): qty 0.5

## 2012-06-11 MED ORDER — FENTANYL CITRATE 0.05 MG/ML IJ SOLN
INTRAMUSCULAR | Status: DC | PRN
  Administered 2012-06-11: 12.5 ug via INTRATHECAL

## 2012-06-11 MED ORDER — PROMETHAZINE HCL 25 MG/ML IJ SOLN: 6.2500 mg | INTRAMUSCULAR | Status: DC | PRN

## 2012-06-11 MED ORDER — OXYTOCIN 10 UNIT/ML IJ SOLN
40.0000 [IU] | INTRAVENOUS | Status: DC | PRN
  Administered 2012-06-11: 40 [IU] via INTRAVENOUS

## 2012-06-11 MED ORDER — ONDANSETRON HCL 4 MG/2ML IJ SOLN: 4.0000 mg | Freq: Three times a day (TID) | INTRAMUSCULAR | Status: DC | PRN

## 2012-06-11 MED ORDER — NALBUPHINE HCL 10 MG/ML IJ SOLN
5.0000 mg | INTRAMUSCULAR | Status: DC | PRN
  Filled 2012-06-11: qty 1

## 2012-06-11 MED ORDER — LACTATED RINGERS IV SOLN
INTRAVENOUS | Status: DC
  Administered 2012-06-11: 1000 mL via INTRAVENOUS

## 2012-06-11 MED ORDER — CEFAZOLIN SODIUM-DEXTROSE 2-3 GM-% IV SOLR
INTRAVENOUS | Status: AC
  Filled 2012-06-11: qty 50

## 2012-06-11 MED ORDER — PRENATAL MULTIVITAMIN CH
1.0000 | ORAL_TABLET | Freq: Every day | ORAL | Status: DC
  Administered 2012-06-11 – 2012-06-13 (×3): 1 via ORAL
  Filled 2012-06-11 (×2): qty 1

## 2012-06-11 MED ORDER — SODIUM CHLORIDE 0.9 % IJ SOLN: 3.0000 mL | INTRAMUSCULAR | Status: DC | PRN

## 2012-06-11 MED ORDER — METOCLOPRAMIDE HCL 5 MG/ML IJ SOLN: 10.0000 mg | Freq: Three times a day (TID) | INTRAMUSCULAR | Status: DC | PRN

## 2012-06-11 MED ORDER — NALOXONE HCL 0.4 MG/ML IJ SOLN: 0.4000 mg | INTRAMUSCULAR | Status: DC | PRN

## 2012-06-11 MED ORDER — MORPHINE SULFATE 0.5 MG/ML IJ SOLN
INTRAMUSCULAR | Status: AC
  Filled 2012-06-11: qty 10

## 2012-06-11 MED ORDER — MENTHOL 3 MG MT LOZG: 1.0000 | LOZENGE | OROMUCOSAL | Status: DC | PRN

## 2012-06-11 SURGICAL SUPPLY — 42 items
APL SKNCLS STERI-STRIP NONHPOA (GAUZE/BANDAGES/DRESSINGS) ×1
BENZOIN TINCTURE PRP APPL 2/3 (GAUZE/BANDAGES/DRESSINGS) ×2 IMPLANT
BINDER ABD UNIV 10 28-50 (GAUZE/BANDAGES/DRESSINGS) ×1 IMPLANT
BINDER ABD UNIV 12 45-62 (WOUND CARE) IMPLANT
BINDER ABDOM UNIV 10 (GAUZE/BANDAGES/DRESSINGS) ×2
BINDER ABDOMINAL 46IN 62IN (WOUND CARE)
CLOTH BEACON ORANGE TIMEOUT ST (SAFETY) ×2 IMPLANT
DRAPE SURG 17X23 STRL (DRAPES) ×2 IMPLANT
DRESSING TELFA 8X3 (GAUZE/BANDAGES/DRESSINGS) ×3 IMPLANT
DRSG COVADERM 4X10 (GAUZE/BANDAGES/DRESSINGS) ×1 IMPLANT
DURAPREP 26ML APPLICATOR (WOUND CARE) ×2 IMPLANT
ELECT REM PT RETURN 9FT ADLT (ELECTROSURGICAL) ×2
ELECTRODE REM PT RTRN 9FT ADLT (ELECTROSURGICAL) ×1 IMPLANT
EXTRACTOR VACUUM M CUP 4 TUBE (SUCTIONS) IMPLANT
GLOVE BIO SURGEON STRL SZ7 (GLOVE) ×2 IMPLANT
GLOVE BIOGEL PI IND STRL 7.0 (GLOVE) ×2 IMPLANT
GLOVE BIOGEL PI INDICATOR 7.0 (GLOVE) ×2
GOWN PREVENTION PLUS LG XLONG (DISPOSABLE) ×2 IMPLANT
GOWN STRL REIN XL XLG (GOWN DISPOSABLE) ×2 IMPLANT
KIT ABG SYR 3ML LUER SLIP (SYRINGE) IMPLANT
NDL HYPO 25X5/8 SAFETYGLIDE (NEEDLE) IMPLANT
NEEDLE HYPO 22GX1.5 SAFETY (NEEDLE) ×2 IMPLANT
NEEDLE HYPO 25X5/8 SAFETYGLIDE (NEEDLE) IMPLANT
NS IRRIG 1000ML POUR BTL (IV SOLUTION) ×2 IMPLANT
PACK C SECTION WH (CUSTOM PROCEDURE TRAY) ×2 IMPLANT
PAD ABD 7.5X8 STRL (GAUZE/BANDAGES/DRESSINGS) ×3 IMPLANT
PAD OB MATERNITY 4.3X12.25 (PERSONAL CARE ITEMS) IMPLANT
RTRCTR C-SECT PINK 25CM LRG (MISCELLANEOUS) IMPLANT
SLEEVE SCD COMPRESS KNEE MED (MISCELLANEOUS) IMPLANT
SPONGE SURGIFOAM ABS GEL 12-7 (HEMOSTASIS) IMPLANT
STAPLER VISISTAT 35W (STAPLE) IMPLANT
STRIP CLOSURE SKIN 1/2X4 (GAUZE/BANDAGES/DRESSINGS) ×2 IMPLANT
SUT PDS AB 0 CTX 60 (SUTURE) IMPLANT
SUT PLAIN 0 NONE (SUTURE) IMPLANT
SUT SILK 0 TIES 10X30 (SUTURE) IMPLANT
SUT VIC AB 0 CT1 36 (SUTURE) ×6 IMPLANT
SUT VIC AB 3-0 CTX 36 (SUTURE) ×2 IMPLANT
SUT VIC AB 4-0 KS 27 (SUTURE) IMPLANT
SYR CONTROL 10ML LL (SYRINGE) ×2 IMPLANT
TOWEL OR 17X24 6PK STRL BLUE (TOWEL DISPOSABLE) ×4 IMPLANT
TRAY FOLEY CATH 14FR (SET/KITS/TRAYS/PACK) ×2 IMPLANT
WATER STERILE IRR 1000ML POUR (IV SOLUTION) ×2 IMPLANT

## 2012-06-11 NOTE — Addendum Note (Signed)
Addendum  created 06/11/12 1050 by Elbert Ewings, CRNA   Modules edited:Notes Section

## 2012-06-11 NOTE — Progress Notes (Signed)
06/11/12 1200  Clinical Encounter Type  Visited With Patient;Health care provider  Visit Type Spiritual support;Social support  Referral From Nurse    Tried twice today to follow up with Ms Chestine Spore, but she was sleeping.  Please page [phone] for night/weekend chaplain as needed for support.  [address] [place], [place] [phone]

## 2012-06-11 NOTE — Transfer of Care (Signed)
Immediate Anesthesia Transfer of Care Note  Patient: Shelley Duffy  Procedure(s) Performed: Procedure(s) (LRB) with comments: CESAREAN SECTION (N/A)  Patient Location: PACU  Anesthesia Type: Spinal  Level of Consciousness: awake, alert , oriented and patient cooperative  Airway & Oxygen Therapy: Patient Spontanous Breathing  Post-op Assessment: Report given to PACU RN and Post -op Vital signs reviewed and stable  Post vital signs: Reviewed and stable  Complications: No apparent anesthesia complications

## 2012-06-11 NOTE — Clinical Social Work Maternal (Signed)
Clinical Social Work Department PSYCHOSOCIAL ASSESSMENT - MATERNAL/CHILD 06/11/2012  Patient:  Duffy,Shelley R  Account Number:  [id number]  Admit Date:  06/11/2012  Childs Name:   Unsure at this time    Clinical Social Worker:  Beckey Polkowski, LCSW   Date/Time:  06/11/2012 11:00 AM  Date Referred:  06/11/2012   Referral source  RN     Referred reason  NICU  Other - See comment  Other - See comment  Substance Abuse   Other referral source:   Patient has hx of Marijuana use, NPNC and limited supports per chart and MOB's bedside RN.    I:  FAMILY / HOME ENVIRONMENT Child's legal guardian:  PARENT  Guardian - Name Guardian - Age Guardian - Address  Shelley Duffy 36 [address] [place], [place]  Shelley Duffy  [place]   Other household support members/support persons Name Relationship DOB  Da'Niyah SISTER 7  Deon'Tae BROTHER 2   Other support:   MOB has a very limited support system.  Her mother is somewhat involved.    II  PSYCHOSOCIAL DATA Information Source:  Patient Interview  Financial and Community Resources Employment:   unemployed   Financial resources:  Medicaid If Medicaid - County:  [place]  School / Grade:   Maternity Care Coordinator / Child Services Coordination / Early Interventions:  Cultural issues impacting care:   None identified.    III  STRENGTHS Strengths  Adequate Resources  Understanding of illness  Other - See comment   Strength comment:  Pediatric follow up will be at [facility]   IV  RISK FACTORS AND CURRENT PROBLEMS Current Problem:  YES   Risk Factor & Current Problem Patient Issue Family Issue Risk Factor / Current Problem Comment  Compliance with Treatment Y N leaving hospital AMA  Family/Relationship Issues Y N limited supports  TRANSPORTATION Y N   Other - See comment Y N childcare  Substance Abuse Y N MOB hx of THC    V  SOCIAL WORK ASSESSMENT SW met with MOB in her third floor  room/309 to introduce myself, complete assessment and evaluate how she is coping with baby's premature birth and admission to NICU.  MOB welcomed SW in and stated that this was a good time to talk.  SW explained reason for visit and support services offered by NICU SW.  SW asked MOB to discuss her supports at home as the understanding from bedside RN is that she left AMA last night and plans to again this evening.  She admits this and states that she has no natural supports. She explained that she has a 34 year old, 34 year old and a 34 year old.  She states that the 34 year old lives with her mother because he wanted to go to [employer] and not to [place], which is MOB's district.  She reports that he stayed home today to watch her 2 year old.  Her mother owns her own cleaning business and is involved in their lives, but not able to help.  MOB states that her mother often cares for her sister's 5 year old as well because her sister is on drugs.  FOB is the same for this baby as for her previous two children and she explained that he is completely unreliable and lives in [place].  They are not currently in a relationship, but he does provide some child support.  MOB's 17 year old has to work tonight   and therefore, there is no one who can care for her two younger children.  SW explained deep concern for her leaving the hospital less than 24 hours after a c-section and strongly encouraged her to think of anyone else she could ask to help in this extenuating circumstance.  She states she is still planning to call people today, but agian said that she just don't have anyone to help.  SW asked her about transportation to visit baby.  She states she takes the bus most places, but that she will only be able to come for a limited amount of time when she can get her mother or son to watch her 2 year old.  SW states understanding, but also stressed the importance of asking people for help so that she can bond with her  baby, provide milk and learn how to care for a premature infant.  She stated understanding.  SW offered a 31 day unlimited bus pass so that transportation will never be a barrier when she can arrange child care. She gladly accepted and was appreciative.  SW asked her about not having PNC and she states she found out she was pregnant in May and it took a long time to get her Medicaid.  She states that it is straightened out now, although he face sheet still says she is a self pay patient.  SW informed MOB of hospital drug screen policy when there is NPNC.  SW also stated awareness of her history of Marijuana use.  She admits to this and states it was infrequent, but thinks that she smoked marijuana on Labor Day at a cook out.  We discussed this issue and other more positive coping mechanisms.  She states she uses Marijuana as an escape and used more around the death of her father a year ago.  She was understanding of the policy and the fact that baby may be positive.  She understands that SW is mandated to make a report if the baby is positive.  She states she had involvement from CPS when her son was born two years ago because he was born positive. She denies all other drug use other than Marijuana.  She states she is working on getting Food Stamps and WIC and takes her children to [facility]. She thinks she can get all necessary baby supplies, but is not currently working.  SW asked her to do what she is able and if she finds herself in need to ask SW for assistance. She was very appreciative.  She appears to have a good understanding of baby's medical situation and is coping well with this stressor in her life.  She reports that her 7 year old daughter was born at 34 weeks in [place] and spent 7 days in the hospital.  SW discussed common emotions related to a NICU hospitalization and acknowledged the potential stress of the situation.  She states no further questions or needs at this  time.  SW asked her to stay in touch with SW as needs arise and gave contact information. SW contacted [place] CPS to ensure that MOB does not have a currently open case.  It was confirmed that she does not.      VI SOCIAL WORK PLAN Social Work Plan  Psychosocial Support/Ongoing Assessment of Needs   Type of pt/family education:   Common emotions related to NICU.  Hospital drug screen policy.   If child protective services report - county:     If child protective services report - date:   Information/referral to community resources comment:   31 day bus pass.   Other social work plan:   SW will monitor drug screens.    

## 2012-06-11 NOTE — Progress Notes (Signed)
Bedside RN states MOB has arranged to be able to stay tonight.  SW met with MOB to see how she is feeling and she states she has worked out a Higher education careers adviser.  She states her mother will be keeping her 34 year old and will bring her 34 year old here to the hospital.  MOB states he will lay with her and watch cartoons.  She states that there is a change that FOB may show up and get him from the hospital this evening.

## 2012-06-11 NOTE — Op Note (Signed)
Shelley Duffy PROCEDURE DATE: 06/11/2012  PREOPERATIVE DIAGNOSIS: Intrauterine pregnancy at  [redacted]w[redacted]d weeks gestation; malpresentation: breech with prolapsed umbilical cord   and non-reassuring fetal status  POSTOPERATIVE DIAGNOSIS: The same  PROCEDURE: Primary/Repeat Low Transverse Cesarean Section  SURGEON:  Dr. Eber Jones L. Harraway-Smith  ASSISTANT:  Drenda Freeze Ches-Dishmon  ANESTHESIA: Spinal INTRAVENOUS FLUIDS: 2000 ml ESTIMATED BLOOD LOSS: 600 ml URINE OUTPUT:  400 ml SPECIMENS: Placenta sent to pathology/L&D COMPLICATIONS: None immediate   INDICATIONS: Shelley Duffy is a 36 y.o. [id number] at [redacted]w[redacted]d here for cesarean section secondary to prol;apsed umbilical cord.  Pt was admitted several days ago for PPROM,  Pt left earlier this evening AGAINST MEDICAL ADVICE and returned a few hours later via EMS with complaints of prolasped umbilical cord.  Patient was brought directly to the OR by EMS.  Verbal consent was obtained.   The risks of cesarean section were discussed with the patient including but were not limited to: bleeding which may require transfusion or reoperation; infection which may require antibiotics; injury to bowel, bladder, ureters or other surrounding organs; injury to the fetus; need for additional procedures including hysterectomy in the event of a life-threatening hemorrhage; placental abnormalities wth subsequent pregnancies, incisional problems, thromboembolic phenomenon and other postoperative/anesthesia complications.   The patient concurred with the proposed plan, giving informed written consent for the procedure.    FINDINGS:  Viable preterm female infant in breech presentation.  Apgars pending. Clear amniotic fluid.  Intact placenta, three vessel cord.  Normal uterus, fallopian tubes and ovaries bilaterally.    PROCEDURE IN DETAIL:  The patient preoperatively received intravenous antibiotics and had sequential compression devices applied to her lower extremities.  She was  then taken to the operating room where spinal anesthesia was administered and was found to be adequate. She was then placed in a dorsal supine position with a leftward tilt, and prepped and draped in a sterile manner.  A foley catheter was placed into her bladder and attached to constant gravity.  After an adequate timeout was performed, a Pfannenstiel skin incision was made with scalpel and carried through to the underlying layer of fascia. The fascia was incised in the midline, and this incision was extended bilaterally using the Mayo scissors.  Kocher clamps were applied to the superior aspect of the fascial incision and the underlying rectus muscles were dissected off bluntly. A similar process was carried out on the inferior aspect of the fascial incision. The rectus muscles were separated in the midline bluntly and the peritoneum was entered bluntly. Attention was turned to the lower uterine segment where a low transverse hysterotomy incision was made with a scalpel and extended bilaterally bluntly.  The infant was successfully delivered, the cord was clamped and cut and the infant was handed over to awaiting neonatology team. Uterine massage was then administered, and the placenta delivered intact with a three-vessel cord. The uterus was then cleared of clot and debris.  The hysterotomy was closed with 0 Vicryl in a running locked fashion, and an imbricating layer was also placed with the same suture. The uterus was returned to the pelvis. The pelvis was cleared of all clot and debris. Hemostasis was confirmed on all surfaces.  The peritoneum and the muscles were reapproximated using 0Vicryl in 1 interrupted suture. The fascia was then closed using 0 Vicryl in a running fashion.  The subcutaneous layer was irrigated, then reapproximated with 3-0 vicryl in a running locked fashion, and the skin was closed with a 4-0 Vicryl subcuticular  stitch. The patient tolerated the procedure well. Sponge, lap, instrument  and needle counts were correct x 2.  She was taken to the recovery room in stable condition.   Drelyn Pistilli L. Harraway-Smith, M.D., [facility]

## 2012-06-11 NOTE — Anesthesia Procedure Notes (Signed)
Spinal  Patient location during procedure: OR Start time: 06/11/2012 1:00 AM End time: 06/11/2012 1:04 AM Staffing Anesthesiologist: Sandrea Hughs Performed by: anesthesiologist  Preanesthetic Checklist Completed: patient identified, site marked, surgical consent, pre-op evaluation, timeout performed, IV checked, risks and benefits discussed and monitors and equipment checked Spinal Block Patient position: right lateral decubitus Prep: DuraPrep Patient monitoring: heart rate, cardiac monitor, continuous pulse ox and blood pressure Approach: midline Location: L3-4 Injection technique: single-shot Needle Needle type: Sprotte and Quincke  Needle gauge: 22 G Needle length: 9 cm Needle insertion depth: 6 cm Assessment Sensory level: T4

## 2012-06-11 NOTE — Anesthesia Preprocedure Evaluation (Signed)
Anesthesia Evaluation  Patient identified by MRN, date of birth, ID band Patient awake    Reviewed: Allergy & Precautions, H&P , NPO status , Patient's Chart, lab work & pertinent test results  Airway Mallampati: I TM Distance: >3 FB Neck ROM: full    Dental No notable dental hx.    Pulmonary neg pulmonary ROS,    Pulmonary exam normal       Cardiovascular negative cardio ROS      Neuro/Psych negative neurological ROS  negative psych ROS   GI/Hepatic negative GI ROS, Neg liver ROS,   Endo/Other  negative endocrine ROS  Renal/GU negative Renal ROS     Musculoskeletal   Abdominal Normal abdominal exam  (+)   Peds  Hematology negative hematology ROS (+)   Anesthesia Other Findings   Reproductive/Obstetrics (+) Pregnancy                           Anesthesia Physical Anesthesia Plan  ASA: II and Emergent  Anesthesia Plan: Spinal   Post-op Pain Management:    Induction:   Airway Management Planned:   Additional Equipment:   Intra-op Plan:   Post-operative Plan:   Informed Consent: I have reviewed the patients History and Physical, chart, labs and discussed the procedure including the risks, benefits and alternatives for the proposed anesthesia with the patient or authorized representative who has indicated his/her understanding and acceptance.     Plan Discussed with: CRNA and Surgeon  Anesthesia Plan Comments:         Anesthesia Quick Evaluation  

## 2012-06-11 NOTE — Progress Notes (Signed)
UR Chart review completed.  

## 2012-06-11 NOTE — Anesthesia Postprocedure Evaluation (Signed)
Anesthesia Post Note  Patient: Shelley Duffy  Procedure(s) Performed: Procedure(s) (LRB): CESAREAN SECTION (N/A)  Anesthesia type: Spinal  Patient location: PACU  Post pain: Pain level controlled  Post assessment: Post-op Vital signs reviewed  Last Vitals:  Filed Vitals:   06/11/12 0215  BP: 119/72  Pulse: 66  Temp:   Resp: 20    Post vital signs: Reviewed  Level of consciousness: awake  Complications: No apparent anesthesia complications

## 2012-06-11 NOTE — Progress Notes (Signed)
06/11/12 1200  Clinical Encounter Type  Visited With Patient;Health care provider  Visit Type Spiritual support;Social support  Referral From Nurse    Tried to visit Ms Treminio yesterday, but she was asleep.  She was nearly asleep just now, so we decided on a follow-up visit in ca one hour.  Familiarized myself with chart and consulted bedside RN for update.   [address] [place], [place] [phone]

## 2012-06-11 NOTE — Progress Notes (Signed)
SW received call from MOB stating that she has asked everyone she knows to ask to care for her children tonight so she can stay in the hospital at least over night.  She states there is no one available and that she has to leave.  SW is concerned but stated understanding.  SW received call from bedside RN and we discussed the situation.  RN states she will talk with unit director.  SW trusts that MOB has exhausted all natural support possibilities. 

## 2012-06-11 NOTE — Anesthesia Postprocedure Evaluation (Signed)
  Anesthesia Post-op Note  Patient: Shelley Duffy  Procedure(s) Performed: Procedure(s) (LRB) with comments: CESAREAN SECTION (N/A)  Patient Location: PACU and Women's Unit  Anesthesia Type: Spinal  Level of Consciousness: awake, alert  and oriented  Airway and Oxygen Therapy: Patient Spontanous Breathing  Post-op Pain: mild  Post-op Assessment: Patient's Cardiovascular Status Stable, Respiratory Function Stable, No signs of Nausea or vomiting, Adequate PO intake and Pain level controlled  Post-op Vital Signs: stable  Complications: No apparent anesthesia complications

## 2012-06-12 NOTE — Progress Notes (Signed)
Subjective: Postpartum Day 1: Cesarean Delivery Patient reports tolerating PO and no problems voiding. She is no longer trying to sign out AMA as she now has FOB involved in child care   Objective: Vital signs in last 24 hours: Temp:  [97.3 F (36.3 C)-98.2 F (36.8 C)] 97.7 F (36.5 C) (09/28 0545) Pulse Rate:  [72-100] 72  (09/28 0545) Resp:  [18-20] 18  (09/28 0545) BP: (96-124)/(62-85) 100/62 mmHg (09/28 0545) SpO2:  [98 %-100 %] 100 % (09/28 0545)  Physical Exam: in bed peacefully with her 2 yo child General: alert Lochia: appropriate Uterine Fundus: firm Dressing: C/D/I DVT Evaluation: No evidence of DVT seen on physical exam.   Basename 06/11/12 0525  HGB 9.2*  HCT 26.5*    Assessment/Plan: Status post Cesarean section. Doing well postoperatively.  Continue current care. Discharge home in the morning.  Diya Gervasi C. 06/12/2012, 7:26 AM

## 2012-06-12 NOTE — Plan of Care (Signed)
Problem: Discharge Progression Outcomes Goal: Barriers To Progression Addressed/Resolved Outcome: Completed/Met Date Met:  06/12/12 Foley out voiding without difficulty Goal: Activity appropriate for discharge plan Outcome: Adequate for Discharge Patient tolerates being up and walking well Goal: Tolerating diet Outcome: Completed/Met Date Met:  06/12/12 Tolerating regular diet well. Goal: Pain controlled with appropriate interventions Outcome: Completed/Met Date Met:  06/12/12 Good pain control with po Percocet and Motrin Goal: Afebrile, VS remain stable at discharge Outcome: Completed/Met Date Met:  06/12/12 VSS at this time

## 2012-06-13 MED ORDER — IBUPROFEN 600 MG PO TABS: 600.0000 mg | ORAL_TABLET | Freq: Four times a day (QID) | ORAL | Status: AC

## 2012-06-13 MED ORDER — PRENATAL MULTIVITAMIN CH: 1.0000 | ORAL_TABLET | Freq: Every day | ORAL | Status: AC

## 2012-06-13 MED ORDER — OXYCODONE-ACETAMINOPHEN 5-325 MG PO TABS: 1.0000 | ORAL_TABLET | ORAL | Status: DC | PRN

## 2012-06-13 NOTE — H&P (Signed)
  Shelley Duffy is a 36 y.o. female presenting via EMS for prolapsed umbilical cord. Pt initially presnet ed 06/08/12 for PROM pt was being treated with antibiotics and bedrest for pregnancy latency.  Early in the day the patient left against medical advice.  When the medical team went to speak to patient to discuss with her the risk to her and the fetus she had ALREADY LEFT the hospital!  She returned ~3hours later complaining of the umbilical cord coming out of her vagina.  She was taken by the EMS directly to the operating room where emergency c-section was performed.  Maternal Medical History:  Reason for admission: prolapsed umbilical cord; rupture of membranes; breech presentation.  Contraction Frequency: irregular.  Perceived severity is severe  Fetal activity: Perceived fetal activity is normal.  Prenatal complications: No prenatal care  OB History    Grav  Para  Term  Preterm  Abortions  TAB  SAB  Ect  Mult  Living    6  4  1  3  1   0  1  0  0  3      Past Medical History   Diagnosis  Date   .  Previous stillbirth or demise, antepartum     Past Surgical History   Procedure  Date   .  Dilation and curettage of uterus    Family History: family history is negative for Anesthesia problems.  Social History: reports that she has been smoking Cigarettes. She has been smoking about .5 packs per day. She has never used smokeless tobacco. She reports that she does not drink alcohol or use illicit drugs.  Prenatal Transfer Tool   Maternal Diabetes: No  Genetic Screening: Declined  Maternal Ultrasounds/Referrals: Normal  Fetal Ultrasounds or other Referrals: None  Maternal Substance Abuse: Yes: Type: Smoker  Significant Maternal Medications: None  Significant Maternal Lab Results: None  Other Comments: no prenatal care  Review of Systems  Constitutional: Negative.  Respiratory: Negative.  Cardiovascular: Negative.  Gastrointestinal: Negative for nausea, vomiting, abdominal pain,  diarrhea and constipation.  Genitourinary: positive for umbilical cord prolapsed from vagina  Musculoskeletal: Negative.  Neurological: Negative.  Psychiatric/Behavioral: Negative.  Dilation: 2cm.  Cord prolapsing through vagina.  Fetal parts palpated through cervix Effacement: 60%  Station: -3  Exam by: Dr. Erin Fulling   Maternal Exam:  Uterine Assessment: Contraction strength is mild. Contraction frequency is irregular.  Abdomen: Fetal presentation: breech  Introitus: Normal vulva. Normal vagina. Umbilical cord in vagina.  Amniotic fluid character: unk  Cervix: Dilation: 2cm.  Cord prolapsing through vagina.  Fetal parts palpated through cervix Effacement: 60%  Station: -3   Fetal Exam  Fetal Monitor Review: Baseline rate: 120's audibly with variables . Done with doppler only  Physical Exam as above.   Assessment/Plan:  Prolapsed umbilical cord in pt with 1.  Preterm premature rupture of membranes in third trimester   2.  History of preterm delivery, currently pregnant   3.  Insufficient prenatal care     Plan: Proceed with emergency c-section.  Verbal consent was obtained by the patient.  Macio Kissoon L. Harraway-Smith, M.D., Evern Core

## 2012-06-13 NOTE — Discharge Summary (Signed)
Attestation of Attending Supervision of Advanced Practitioner (CNM/NP): Evaluation and management procedures were performed by the Advanced Practitioner under my supervision and collaboration.  I have reviewed the Advanced Practitioner's note and chart, and I agree with the management and plan.  HARRAWAY-SMITH, Johne Buckle 7:43 PM     

## 2012-06-13 NOTE — Progress Notes (Signed)
Follow up appointment, activity, incision care and medication teaching provided to patient.  No questions at this time.  Patient left unit with educational materials, prescriptions and personal belongings in stable condition.  Osvaldo Angst, RN--------

## 2012-06-13 NOTE — Discharge Summary (Signed)
Obstetric Discharge Summary Reason for Admission: rupture of membranes Prenatal Procedures: ultrasound Intrapartum Procedures: cesarean: low cervical, transverse Postpartum Procedures: none Complications-Operative and Postpartum: none Hemoglobin  Date Value Range Status  06/11/2012 9.2* 12.0 - 15.0 g/dL Final     HCT  Date Value Range Status  06/11/2012 26.5* 36.0 - 46.0 % Final    Physical Exam:  General: alert, cooperative and no distress Lochia: appropriate Uterine Fundus: firm Incision: healing well DVT Evaluation: No evidence of DVT seen on physical exam.  Discharge Diagnoses: preterm delivery doing well  Discharge Information: Date: 06/13/2012 Activity: pelvic rest and no heavy lifting or driving Diet: routine Medications: PNV, Ibuprofen and Percocet Condition: stable Instructions: refer to practice specific booklet and cesarean section care after Discharge to: home Follow-up Information    Follow up with [facility]. Call in 6 weeks.   Contact information:   [address] [place] [address] [phone]         Newborn Data: Live born female  Birth Weight: 3 lb 3.9 oz (1470 g) APGAR: 6, 8 Baby in ICU  ARNOLD,JAMES 06/13/2012, 7:03 AM

## 2012-06-14 ENCOUNTER — Encounter (HOSPITAL_COMMUNITY): Payer: Self-pay | Admitting: Obstetrics & Gynecology

## 2012-07-12 ENCOUNTER — Ambulatory Visit (INDEPENDENT_AMBULATORY_CARE_PROVIDER_SITE_OTHER): Payer: Self-pay | Admitting: Advanced Practice Midwife

## 2012-07-12 ENCOUNTER — Encounter: Payer: Self-pay | Admitting: Advanced Practice Midwife

## 2012-07-12 VITALS — BP 120/95 | HR 90 | Temp 97.6°F | Ht 66.0 in | Wt 129.5 lb

## 2012-07-12 DIAGNOSIS — Z3049 Encounter for surveillance of other contraceptives: Secondary | ICD-10-CM

## 2012-07-12 DIAGNOSIS — Z98891 History of uterine scar from previous surgery: Secondary | ICD-10-CM

## 2012-07-12 LAB — POCT PREGNANCY, URINE: Preg Test, Ur: NEGATIVE

## 2012-07-12 MED ORDER — MEDROXYPROGESTERONE ACETATE 150 MG/ML IM SUSP
150.0000 mg | Freq: Once | INTRAMUSCULAR | Status: AC
  Administered 2012-07-12: 150 mg via INTRAMUSCULAR

## 2012-07-12 MED ORDER — MEDROXYPROGESTERONE ACETATE 150 MG/ML IM SUSP: 150.0000 mg | INTRAMUSCULAR | Status: AC

## 2012-07-12 NOTE — Progress Notes (Signed)
  Subjective:     Shelley Duffy is a 36 y.o. female who presents for a postpartum visit. She is 4 weeks postpartum following a low cervical transverse Cesarean section. I have fully reviewed the prenatal and intrapartum course. The delivery was at 32.2 gestational weeks. Outcome: primary cesarean section, low transverse incision. Anesthesia: regional. Postpartum course has been uneventful. Baby's course has been normal for a preterm baby.  Came home a week ago. Baby is feeding by bottle - . Bleeding no bleeding. Bowel function is normal. Bladder function is normal. Patient is not sexually active. Contraception method is Depo-Provera injections.  Wants BTL when Medicaid comes in.  Postpartum depression screening: negative.  The following portions of the patient's history were reviewed and updated as appropriate: allergies, current medications, past family history, past medical history, past social history, past surgical history and problem list.  Review of Systems Pertinent items are noted in HPI.   Objective:    BP 120/95  Pulse 90  Temp 97.6 F (36.4 C) (Oral)  Ht 5\' 6"  (1.676 m)  Wt 129 lb 8 oz (58.741 kg)  BMI 20.90 kg/m2  Breastfeeding? No  General:  alert, cooperative and no distress   Breasts:  inspection negative, no nipple discharge or bleeding, no masses or nodularity palpable  Lungs:   Heart:  regular rate and rhythm  Abdomen: soft, non-tender; bowel sounds normal; no masses,  no organomegaly   Vulva:  normal  Vagina: normal vagina  Cervix:  no lesions  Corpus: normal size, contour, position, consistency, mobility, non-tender  Adnexa:  normal adnexa and no mass, fullness, tenderness  Rectal Exam: Not performed.        Assessment:    Normal postoperative/ postpartum exam. Pap smear not done at today's visit.   Plan:    1. Contraception: Depo-Provera injections 2. Will schedule outpatient BTL when Medicaid comes through. 3. Follow up in: 1 year or as needed.

## 2012-07-12 NOTE — Patient Instructions (Signed)
Laparoscopic Tubal Ligation Laparoscopic tubal ligation is a procedure that closes the fallopian tubes at a time other than right after childbirth. By closing the fallopian tubes, the eggs that are released from the ovaries cannot enter the uterus and sperm cannot reach the egg. Tubal ligation is also known as getting your "tubes tied." Tubal ligation is done so you will not be able to get pregnant or have a baby.  Although this procedure may be reversed, it should be considered permanent and irreversible. If you want to have future pregnancies, you should not have this procedure.  LET YOUR CAREGIVER KNOW ABOUT:  Allergies to food or medicine.  Medicines taken, including vitamins, herbs, eyedrops, over-the-counter medicines, and creams.  Use of steroids (by mouth or creams).  Previous problems with numbing medicines.  History of bleeding problems or blood clots.  Any recent colds or infections.  Previous surgery.  Other health problems, including diabetes and kidney problems.  Possibility of pregnancy, if this applies.  Any past pregnancies. RISKS AND COMPLICATIONS   Infection.  Bleeding.  Injury to surrounding organs.  Anesthetic side effects.  Failure of the procedure.  Ectopic pregnancy.  Future regret about having the procedure done. BEFORE THE PROCEDURE  Do not take aspirin or blood thinners a week before the procedure or as directed. This can cause bleeding.  Do not eat or drink anything 6 to 8 hours before the procedure. PROCEDURE   You may be given a medicine to help you relax (sedative) before the procedure. You will be given a medicine to make you sleep (general anesthetic) during the procedure.  A tube will be put down your throat to help your breath while under general anesthesia.  Two small cuts (incisions) are made in the lower abdominal area and near the belly button.  Your abdominal area will be inflated with a safe gas (carbon dioxide). This helps  give the surgeon room to operate, visualize, and helps the surgeon avoid other organs.  A thin, lighted tube (laparoscope) with a camera attached is inserted into your abdomen through one of the incisions near the belly button. Other small instruments are also inserted through the other abdominal incision.  The fallopian tubes are located and are either blocked with a ring, clip, or are burned (cauterized).  After the fallopian tubes are blocked, the gas is released from the abdomen.  The incisions will be closed with stitches (sutures), and a bandage may be placed over the incisions. AFTER THE PROCEDURE   You will rest in a recovery room for 1 4 hours until you are stable and doing well.  You will also have some mild abdominal discomfort for 3 7 days. You will be given pain medicine to ease any discomfort.  As long as there are no problems, you may be allowed to go home. Someone will need to drive you home and be with you for at least 24 hours once home.  You may have some mild discomfort in the throat. This is from the tube placed in your throat while you were sleeping.  You may experience discomfort in the shoulder area from some trapped air between the liver and diaphragm. This sensation is normal and will slowly go away on its own. Document Released: 12/08/2000 Document Revised: 03/02/2012 Document Reviewed: 12/13/2011 [employer] Patient Information 2013 [employer].  

## 2012-08-02 ENCOUNTER — Encounter: Payer: Medicaid Other | Admitting: Obstetrics & Gynecology
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
## 2014-07-17 ENCOUNTER — Encounter: Payer: Self-pay | Admitting: Advanced Practice Midwife

## 2015-12-11 ENCOUNTER — Inpatient Hospital Stay (HOSPITAL_COMMUNITY)
Admission: AD | Admit: 2015-12-11 | Discharge: 2015-12-11 | Discharge status: Against Medical Advice | Payer: Medicaid Other | Source: Ambulatory Visit | Attending: Family Medicine | Admitting: Family Medicine

## 2015-12-11 DIAGNOSIS — Z5321 Procedure and treatment not carried out due to patient leaving prior to being seen by health care provider: Secondary | ICD-10-CM | POA: Insufficient documentation

## 2015-12-11 DIAGNOSIS — Z3202 Encounter for pregnancy test, result negative: Secondary | ICD-10-CM | POA: Insufficient documentation

## 2015-12-11 LAB — CBC
HCT: 34.7 % — ABNORMAL LOW (ref 36.0–46.0)
HEMOGLOBIN: 12.2 g/dL (ref 12.0–15.0)
MCH: 32.2 pg (ref 26.0–34.0)
MCHC: 35.2 g/dL (ref 30.0–36.0)
MCV: 91.6 fL (ref 78.0–100.0)
PLATELETS: 424 10*3/uL — AB (ref 150–400)
RBC: 3.79 MIL/uL — AB (ref 3.87–5.11)
RDW: 13.4 % (ref 11.5–15.5)
WBC: 20.4 10*3/uL — AB (ref 4.0–10.5)

## 2015-12-11 LAB — URINALYSIS, ROUTINE W REFLEX MICROSCOPIC
Bilirubin Urine: NEGATIVE
GLUCOSE, UA: NEGATIVE mg/dL
Hgb urine dipstick: NEGATIVE
Ketones, ur: 15 mg/dL — AB
LEUKOCYTES UA: NEGATIVE
NITRITE: NEGATIVE
PROTEIN: NEGATIVE mg/dL
Specific Gravity, Urine: 1.025 (ref 1.005–1.030)
pH: 6 (ref 5.0–8.0)

## 2015-12-11 LAB — POCT PREGNANCY, URINE: Preg Test, Ur: NEGATIVE

## 2015-12-11 NOTE — MAU Note (Signed)
Kids had the flu about 2 1/2 wks ago.  Then 2 wks ago she felt like she was catching. Got through that, then Friday night - she started having cramping in lower abd, came back again today. If she takes something for pain- it goes away, then will come back. Doesn't know if she is preg,miscarrying, ectopic or what.  Just knows it is some type of female problem.

## 2015-12-12 LAB — HIV ANTIBODY (ROUTINE TESTING W REFLEX): HIV SCREEN 4TH GENERATION: NONREACTIVE

## 2022-03-06 ENCOUNTER — Encounter (HOSPITAL_COMMUNITY): Payer: Self-pay

## 2022-03-06 ENCOUNTER — Ambulatory Visit (HOSPITAL_COMMUNITY)
Admission: RE | Admit: 2022-03-06 | Discharge: 2022-03-06 | Disposition: A | Discharge status: Home / Self Care | Payer: Self-pay | Source: Ambulatory Visit

## 2022-03-06 VITALS — BP 149/88 | HR 97 | Temp 98.4°F | Resp 14

## 2022-03-06 DIAGNOSIS — N898 Other specified noninflammatory disorders of vagina: Secondary | ICD-10-CM | POA: Insufficient documentation

## 2022-03-06 DIAGNOSIS — A599 Trichomoniasis, unspecified: Secondary | ICD-10-CM | POA: Insufficient documentation

## 2022-03-06 MED ORDER — METRONIDAZOLE 500 MG PO TABS: 500.0000 mg | ORAL_TABLET | Freq: Two times a day (BID) | ORAL | 0 refills | Status: AC

## 2022-03-06 NOTE — ED Triage Notes (Signed)
States partner tested positive for trichomonas last week. C/O small amt vaginal discharge and slight low abd pain onset last wk. Denies fevers.

## 2022-03-06 NOTE — Discharge Instructions (Addendum)
Avoid all forms of sexual intercourse (oral, vaginal, anal) for the next 7 days to avoid spreading/reinfecting or at least until we can see what kinds of infection results are positive.  Abstaining for 2 weeks would be better but at least 1 week is required.  We will let you know about your test results from the swab we did today and if you need any prescriptions for antibiotics or changes to your treatment from today.  

## 2022-03-06 NOTE — ED Provider Notes (Signed)
[facility]   MRN: [id number] DOB: 12-21-1975  Subjective:   Shelley Duffy is a 46 y.o. female presenting for slight vaginal discharge and mild pelvic pains.  Does believe this did resolve.  Had exposure to trichomoniasis from her sex partner at the beginning of June. Denies fever, n/v, abdominal pain, rashes, dysuria, urinary frequency, hematuria. LMP was 02/18/2022, has not been sexually active since then.  She is not taking any contraception but has no concern for pregnancy.   No current facility-administered medications for this encounter.  Current Outpatient Medications:    Multiple Vitamins-Minerals (MULTIVITAMIN WOMEN PO), Take by mouth., Disp: , Rfl:    ibuprofen (ADVIL,MOTRIN) 600 MG tablet, Take 1 tablet (600 mg total) by mouth every 6 (six) hours., Disp: 30 tablet, Rfl: 1   medroxyPROGESTERone (DEPO-PROVERA) 150 MG/ML injection, Inject 1 mL (150 mg total) into the muscle every 3 (three) months., Disp: 1 mL, Rfl: 0   Prenatal Vit-Fe Fumarate-FA (PRENATAL MULTIVITAMIN) TABS, Take 1 tablet by mouth daily., Disp: 30 tablet, Rfl: 2   No Known Allergies  Past Medical History:  Diagnosis Date   Previous stillbirth or demise, antepartum      Past Surgical History:  Procedure Laterality Date   CESAREAN SECTION  06/11/2012   Procedure: CESAREAN SECTION;  Surgeon: Willodean Rosenthal, MD;  Location: [facility];  Service: Obstetrics;  Laterality: N/A;   DILATION AND CURETTAGE OF UTERUS      Family History  Problem Relation Age of Onset   Healthy Mother    Anesthesia problems Neg Hx     Social History   Tobacco Use   Smoking status: Every Day    Packs/day: 0.50    Years: 10.00    Total pack years: 5.00    Types: Cigarettes   Smokeless tobacco: Never  Vaping Use   Vaping Use: Never used  Substance Use Topics   Alcohol use: Not Currently    Comment: stopped 1 wk ago   Drug use: No    ROS   Objective:   Vitals: BP (!) 149/88   Pulse 97    Temp 98.4 F (36.9 C) (Oral)   Resp 14   LMP 02/18/2022 (Approximate)   SpO2 95%   Physical Exam Constitutional:      General: She is not in acute distress.    Appearance: Normal appearance. She is well-developed. She is not ill-appearing, toxic-appearing or diaphoretic.  HENT:     Head: Normocephalic and atraumatic.     Nose: Nose normal.     Mouth/Throat:     Mouth: Mucous membranes are moist.  Eyes:     General: No scleral icterus.       Right eye: No discharge.        Left eye: No discharge.     Extraocular Movements: Extraocular movements intact.  Cardiovascular:     Rate and Rhythm: Normal rate.  Pulmonary:     Effort: Pulmonary effort is normal.  Skin:    General: Skin is warm and dry.  Neurological:     General: No focal deficit present.     Mental Status: She is alert and oriented to person, place, and time.  Psychiatric:        Mood and Affect: Mood normal.        Behavior: Behavior normal.     Assessment and Plan :   PDMP not reviewed this encounter.  1. Trichomoniasis   2. Vaginal discharge    Given her  exposure, recommended empiric treatment with metronidazole.  Patient declined a pregnancy test.  We will base further treatment off of her vaginal swab results.  Recommended abstaining for at least 1 week.  Counseled patient on potential for adverse effects with medications prescribed/recommended today, ER and return-to-clinic precautions discussed, patient verbalized understanding.    Wallis Bamberg, [place] 03/06/22 [phone]

## 2022-03-07 LAB — CERVICOVAGINAL ANCILLARY ONLY
Bacterial Vaginitis (gardnerella): POSITIVE — AB
Candida Glabrata: NEGATIVE
Candida Vaginitis: NEGATIVE
Chlamydia: NEGATIVE
Comment: NEGATIVE
Comment: NEGATIVE
Comment: NEGATIVE
Comment: NEGATIVE
Comment: NEGATIVE
Comment: NORMAL
Neisseria Gonorrhea: NEGATIVE
Trichomonas: POSITIVE — AB
